# Patient Record
Sex: Male | Born: 1990 | Race: White | Hispanic: No | Marital: Single | Smoking: Never smoker
Health system: Southern US, Community
[De-identification: ages and names within clinical notes are randomized; demographics above are authoritative.]

## PROBLEM LIST (undated history)

## (undated) DIAGNOSIS — D649 Anemia, unspecified: Secondary | ICD-10-CM

## (undated) DIAGNOSIS — R109 Unspecified abdominal pain: Secondary | ICD-10-CM

## (undated) DIAGNOSIS — K589 Irritable bowel syndrome without diarrhea: Secondary | ICD-10-CM

## (undated) HISTORY — DX: Unspecified abdominal pain: R10.9

## (undated) HISTORY — PX: WISDOM TOOTH EXTRACTION: SHX21

## (undated) HISTORY — DX: Irritable bowel syndrome, unspecified: K58.9

## (undated) HISTORY — DX: Anemia, unspecified: D64.9

---
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
## 2012-03-03 ENCOUNTER — Emergency Department
Admission: EM | Admit: 2012-03-03 | Discharge: 2012-03-03 | Disposition: A | Discharge status: Home / Self Care | Payer: Managed Care, Other (non HMO) | Source: Home / Self Care

## 2012-03-03 ENCOUNTER — Encounter: Payer: Self-pay | Admitting: *Deleted

## 2012-03-03 DIAGNOSIS — Z9181 History of falling: Secondary | ICD-10-CM

## 2012-03-03 DIAGNOSIS — T1490XA Injury, unspecified, initial encounter: Secondary | ICD-10-CM

## 2012-03-03 NOTE — ED Provider Notes (Signed)
History     CSN: [id number]  Arrival date & time 03/03/12  1150   None     Chief Complaint  Patient presents with  . Fall   HPI Comments: Pt was playing soccer earlier today.when pt fell and hit back of his on the grass. Pt was sent her by training staff for medical clearance. Pt denies any LOC, headache, confusion, blurry vision, vision loss, or confusion associated with the episode. Pt denies any headache or head pain.   Patient is a 21 y.o. male presenting with fall. The history is provided by the patient.  Fall The accident occurred less than 1 hour ago. The fall occurred while recreating/playing. Distance fallen: fell while playng soccer  Pertinent negatives include no numbness and no headaches.    Past Medical History  Diagnosis Date  . Abdominal pain, recurrent   . Anemia     History reviewed. No pertinent past surgical history.  Family History  Problem Relation Age of Onset  . Cancer Mother     luekemia  . Cholelithiasis Mother   . Ulcers Maternal Aunt   . Inflammatory bowel disease Neg Hx   . Celiac disease Neg Hx     History  Substance Use Topics  . Smoking status: Never Smoker   . Smokeless tobacco: Not on file  . Alcohol Use: No      Review of Systems  Constitutional: Negative for appetite change and fatigue.  HENT: Negative for neck stiffness.   Eyes: Negative for photophobia and visual disturbance.  Cardiovascular: Negative for chest pain.  Musculoskeletal: Negative for arthralgias.  Neurological: Negative for dizziness, tremors, speech difficulty, weakness, numbness and headaches.    Allergies  Review of patient's allergies indicates no known allergies.  Home Medications   Current Outpatient Rx  Name Route Sig Dispense Refill  . DICLOFENAC POTASSIUM 50 MG PO TABS      . HYDROCODONE-ACETAMINOPHEN 5-500 MG PO TABS        BP 114/75  Pulse 57  Temp(Src) 98.1 F (36.7 C) (Oral)  Resp 18  Ht 5\' 7"  (1.702 m)  Wt 152 lb 8 oz (69.174 kg)   BMI 23.89 kg/m2  SpO2 100%  Physical Exam  Constitutional: He is oriented to person, place, and time. He appears well-developed and well-nourished.  HENT:  Head: Normocephalic and atraumatic.  Right Ear: External ear normal.  Left Ear: External ear normal.  Eyes: Conjunctivae are normal. Pupils are equal, round, and reactive to light.  Neck: Normal range of motion. Neck supple.  Cardiovascular: Normal rate and regular rhythm.   Pulmonary/Chest: Effort normal and breath sounds normal.  Abdominal: Soft. Bowel sounds are normal.  Musculoskeletal: Normal range of motion.  Neurological: He is alert and oriented to person, place, and time. No cranial nerve deficit. Coordination normal.       No focal neurological deficits noted on full neurological exam.   Skin: Skin is warm and dry. There is pallor.  Psychiatric: He has a normal mood and affect.    ED Course  Procedures (including critical care time)  Labs Reviewed - No data to display No results found.   No diagnosis found.    MDM  Otherwise normal physical exam. Pt is otherwise cleared to return to play today. Discussed concussive and neuro red flags including headache, dizziness, LOC, vomiting. Extensive counseling done. Concussion handout given. Will follow prn.         Floydene Flock, MD 03/03/12 1455  Floydene Flock, MD  03/03/12 1456 

## 2012-03-03 NOTE — ED Notes (Signed)
Pt was knocked to the ground this morning in a soccer game and hit the back of his head on the ground.  Pt denies any pain or symptoms

## 2012-03-03 NOTE — Discharge Instructions (Signed)
Concussion and Brain Injury A blow to the head can stop the brain from working normally (concussion). It is usually not life-threatening. However, the results of the injury can be serious. Problems caused by the injury might show up right away or days or weeks later. Getting better might take some time. HOME CARE  Rest your body. Ways to rest your body include:   Getting plenty of sleep at night.   Going to sleep early.   Taking naps during the day when you feel tired.   Limit activities that require a lot of thought. This includes:   Time spent with homework.   Time spent with work related to a job.   TV watching.   Computer use.   Return to normal activities (driving, work, school) only when your doctor says it is okay.   Avoid high impact activity and sports until your doctor says it is okay.   Take medicines only as told by your doctor.   Do not drink alcohol until your doctor says it is okay.   Do not make important decisions without help until you feel better.   Follow up with your doctor as told.  GET HELP RIGHT AWAY IF:  You, your family, or your friends notice that:  You have bad headaches, or they get worse.   You have weakness, loss of feeling (numbness), or you feel off balance.   You keep throwing up (vomiting).   You feel tired or pass out (faint).   One black center of your eye (pupil) is larger than the other.   You twitch or shake (seize).   Your speech is not clear (slurred).   You are confused, restless, easily angered (agitated), or annoyed (irritable).   You cannot recognize or respond to people or activities.   You have neck pain.   You have trouble being woken up.   Your behavior changes.  MAKE SURE YOU:   Understand these instructions.   Will watch your condition.   Will get help right away if you are not doing well or get worse.  Document Released: 10/05/2009 Document Revised: 10/06/2011 Document Reviewed:  10/05/2009 [employer] Patient Information 2012 [employer]. 

## 2012-03-05 NOTE — ED Provider Notes (Signed)
Agree with exam, assessment, and plan.   Lattie Haw, MD 03/05/12 [phone]

## 2012-03-16 ENCOUNTER — Ambulatory Visit: Payer: Managed Care, Other (non HMO) | Admitting: *Deleted

## 2012-03-19 ENCOUNTER — Ambulatory Visit: Payer: Managed Care, Other (non HMO) | Admitting: Family Medicine

## 2012-03-20 ENCOUNTER — Ambulatory Visit (INDEPENDENT_AMBULATORY_CARE_PROVIDER_SITE_OTHER): Payer: Managed Care, Other (non HMO) | Admitting: Family Medicine

## 2012-03-20 DIAGNOSIS — Z23 Encounter for immunization: Secondary | ICD-10-CM

## 2012-03-20 DIAGNOSIS — Z0000 Encounter for general adult medical examination without abnormal findings: Secondary | ICD-10-CM

## 2012-05-04 ENCOUNTER — Telehealth: Payer: Self-pay | Admitting: Family Medicine

## 2012-05-04 DIAGNOSIS — R109 Unspecified abdominal pain: Secondary | ICD-10-CM

## 2012-05-04 NOTE — Telephone Encounter (Signed)
Pt would like to be referred to GI pt is still experiencing same symptoms as last year.

## 2012-05-05 NOTE — Telephone Encounter (Signed)
ok 

## 2012-05-16 ENCOUNTER — Telehealth: Payer: Self-pay | Admitting: Family Medicine

## 2012-05-16 NOTE — Telephone Encounter (Signed)
Mailed 55 pages to Dr. Kelle Darting for review on 05-16-12 ym

## 2012-05-16 NOTE — Telephone Encounter (Signed)
Forward/fax 2 pages to Dr. Kelle Darting for review on 05-16-12 ym

## 2012-05-16 NOTE — Telephone Encounter (Signed)
Forward 4 pages to Dr. Kelle Darting for review on 05-16-12 ym

## 2012-05-22 ENCOUNTER — Ambulatory Visit: Payer: Managed Care, Other (non HMO) | Admitting: Gastroenterology

## 2012-06-12 ENCOUNTER — Encounter: Payer: Self-pay | Admitting: Gastroenterology

## 2012-06-12 ENCOUNTER — Other Ambulatory Visit (INDEPENDENT_AMBULATORY_CARE_PROVIDER_SITE_OTHER): Payer: Managed Care, Other (non HMO)

## 2012-06-12 ENCOUNTER — Ambulatory Visit (INDEPENDENT_AMBULATORY_CARE_PROVIDER_SITE_OTHER): Payer: Managed Care, Other (non HMO) | Admitting: Gastroenterology

## 2012-06-12 VITALS — BP 118/64 | HR 76 | Ht 67.0 in | Wt 154.0 lb

## 2012-06-12 DIAGNOSIS — K589 Irritable bowel syndrome without diarrhea: Secondary | ICD-10-CM

## 2012-06-12 DIAGNOSIS — R197 Diarrhea, unspecified: Secondary | ICD-10-CM

## 2012-06-12 LAB — BASIC METABOLIC PANEL
BUN: 13 mg/dL (ref 6–23)
CO2: 26 mEq/L (ref 19–32)
Calcium: 9.5 mg/dL (ref 8.4–10.5)
Creatinine, Ser: 0.9 mg/dL (ref 0.4–1.5)
Glucose, Bld: 87 mg/dL (ref 70–99)

## 2012-06-12 LAB — CBC WITH DIFFERENTIAL/PLATELET
Basophils Absolute: 0 10*3/uL (ref 0.0–0.1)
Basophils Relative: 0.4 % (ref 0.0–3.0)
Eosinophils Absolute: 0.1 10*3/uL (ref 0.0–0.7)
Lymphocytes Relative: 38.8 % (ref 12.0–46.0)
MCHC: 32.7 g/dL (ref 30.0–36.0)
Monocytes Absolute: 0.4 10*3/uL (ref 0.1–1.0)
Neutrophils Relative %: 47.4 % (ref 43.0–77.0)
Platelets: 202 10*3/uL (ref 150.0–400.0)
RBC: 5.11 Mil/uL (ref 4.22–5.81)

## 2012-06-12 LAB — HEPATIC FUNCTION PANEL
ALT: 25 U/L (ref 0–53)
Albumin: 4.3 g/dL (ref 3.5–5.2)
Total Bilirubin: 0.5 mg/dL (ref 0.3–1.2)

## 2012-06-12 LAB — C-REACTIVE PROTEIN: CRP: <1 mg/dL (ref 1–20)

## 2012-06-12 LAB — FERRITIN: Ferritin: 12.1 ng/mL — ABNORMAL LOW (ref 22.0–322.0)

## 2012-06-12 MED ORDER — HYOSCYAMINE SULFATE 0.125 MG SL SUBL: 0.1250 mg | SUBLINGUAL_TABLET | SUBLINGUAL | Status: DC | PRN

## 2012-06-12 NOTE — Progress Notes (Signed)
History of Present Illness:  This is a 21 year old Caucasian male who has had abdominal cramping and loose stools with occasional constipation for the last 2 years. He is been expertly evaluated by Dr. Bing Plume, pediatric gastroenterology. His records and labs were reviewed in detail today. The patient continues with some lower abdominal cramping, incomplete rectal emptying without rectal bleeding or systemic, upper GI, or hepatobiliary complaints. His appetite is good and his weight is stable, and he denies any specific food intolerances. The patient denies use of sorbitol or by mouth fructose. There has been some question about lactose intolerance, but the patient denies problems with lactose. One year ago the patient had upper GI small bowel series that was normal. Stool exams of all been negative including examination for fecal leukocytes. At one point the patient had mild iron deficiency anemia felt secondary to running, and he apparently responded to by mouth oral iron therapy by Dr. Kelle Darting. Throughout the exam and interview the patient's father was in the room, and he relates that the patient has not had problems throughout his childhood with gastrointestinal problems. They have traveled extensively, but have not had enteritis or dysentery. Prior evaluation for celiac disease was negative.  I have reviewed this patient's present history, medical and surgical past history, allergies and medications.     ROS: The remainder of the 10 point ROS is negative     Physical Exam: Blood pressure 118/64, pulse 76, weight 154 pounds with BMI 24.12. General well developed well nourished patient in no acute distress, appearing HIS stated age Eyes PERRLA, no icterus, fundoscopic exam per opthamologist Skin no lesions noted Neck supple, no adenopathy, no thyroid enlargement, no tenderness Chest clear to percussion and auscultation Heart no significant murmurs, gallops or rubs noted Abdomen no  hepatosplenomegaly masses or tenderness, BS normal Extremities no acute joint lesions, edema, phlebitis or evidence of cellulitis. Neurologic patient oriented x 3, cranial nerves intact, no focal neurologic deficits noted. Psychological mental status normal and normal affect.  Assessment and plan: IBS with incomplete sigmoid colon emptying. I have placed him on a fodmap-ibs diet and have prescribed daily Metamucil 1 heaping teaspoon which use every morning. Also have prescribed when necessary sublingual Levsin as tolerated. We will repeat his labs including followup iron studies, CRP, Giardia antibodies, and I have shown the patient and his father a movie on IBS and its management. Repeat celiac serologies also ordered.  Please copy her primary care physician, referring physician, and pertinent subspecialists. No diagnosis found.

## 2012-06-12 NOTE — Patient Instructions (Addendum)
Your physician has requested that you go to the basement for the following lab work before leaving today: Vitamin B12, TSH, IBC panel, Hepatic function panel, Giardia Lamblia, Folate, Ferritin, Celiac panel, CBC, CRP, Bmet.  We have sent the following medications to your pharmacy for you to pick up at your convenience: Levsin.  You have been given a Fodmap diet and you watched the IBS video today.  cc: Kelle Darting, MD

## 2012-06-13 LAB — CELIAC PANEL 10
Endomysial Screen: NEGATIVE
IgA: 113 mg/dL (ref 68–379)
Tissue Transglut Ab: 5.3 U/mL (ref <20)
Tissue Transglutaminase Ab, IgA: 3.1 U/mL (ref <20)

## 2012-06-15 LAB — GIARDIA LAMBLIA ANTIBODY, IFA

## 2012-08-31 ENCOUNTER — Telehealth: Payer: Self-pay | Admitting: Family Medicine

## 2012-08-31 NOTE — Telephone Encounter (Signed)
Caller: Lance/Father; Patient Name: Antonio Galvan; PCP: Kelle Darting [facility]); Best Callback Phone Number: [phone]. DAD reports while at college, having anxiety issues. Health information. Advised appointment needed. States he will check with insurance to see if referral needed? call back parameters reviewed.

## 2012-09-25 ENCOUNTER — Ambulatory Visit: Payer: Managed Care, Other (non HMO) | Admitting: Family Medicine

## 2012-09-26 ENCOUNTER — Encounter: Payer: Self-pay | Admitting: Family Medicine

## 2012-09-26 ENCOUNTER — Ambulatory Visit (INDEPENDENT_AMBULATORY_CARE_PROVIDER_SITE_OTHER): Payer: BC Managed Care – PPO | Admitting: Family Medicine

## 2012-09-26 VITALS — BP 110/80 | Temp 98.1°F | Wt 155.0 lb

## 2012-09-26 DIAGNOSIS — F988 Other specified behavioral and emotional disorders with onset usually occurring in childhood and adolescence: Secondary | ICD-10-CM | POA: Insufficient documentation

## 2012-09-26 MED ORDER — AMPHETAMINE-DEXTROAMPHET ER 10 MG PO CP24: 10.0000 mg | ORAL_CAPSULE | ORAL | Status: DC

## 2012-09-26 NOTE — Patient Instructions (Signed)
Take the Adderall one daily  Return January 2 for followup

## 2012-09-26 NOTE — Progress Notes (Signed)
  Subjective:    Patient ID: Antonio Galvan, male    DOB: Aug 11, 1991, 21 y.o.   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 Since sees gynecology central of focus and concentration. He went to the school [facility] and he was given Medrol Zoloft 50 mg daily which she started about 4 weeks ago. It hasn't seemed to help.  Family history pertinent and he thinks his mother has ADD untreated and undiagnosed   Review of Systems    general review of systems otherwise negative Objective:   Physical Exam  Well-developed well-nourished in no acute distress      Assessment & Plan:  Question ADD plan trial of Adderall return in 3 weeks for followup

## 2012-11-01 ENCOUNTER — Ambulatory Visit: Payer: BC Managed Care – PPO | Admitting: Family Medicine

## 2013-04-11 ENCOUNTER — Encounter: Payer: Self-pay | Admitting: Family Medicine

## 2013-04-11 ENCOUNTER — Ambulatory Visit (INDEPENDENT_AMBULATORY_CARE_PROVIDER_SITE_OTHER): Payer: BC Managed Care – PPO | Admitting: Family Medicine

## 2013-04-11 VITALS — BP 140/90 | Temp 98.8°F | Wt 172.0 lb

## 2013-04-11 DIAGNOSIS — K623 Rectal prolapse: Secondary | ICD-10-CM | POA: Insufficient documentation

## 2013-04-11 DIAGNOSIS — K589 Irritable bowel syndrome without diarrhea: Secondary | ICD-10-CM

## 2013-04-11 NOTE — Progress Notes (Signed)
  Subjective:    Patient ID: Antonio Galvan, male    DOB: 1991/01/20, 22 y.o.   MRN: [id number]  HPI Antonio Galvan is a 22 year old male single nonsmoker who comes in today for evaluation of "GI concerns"  He's had a complete GI workup year ago by Dr. Jarold Motto. The analysis showed that he had IBS. They've given him Levsin to take when necessary. He's tried to adjust his diet but doesn't seem to make any difference. His gluten studies were all normal. 2 months ago he noticed it when he has a bowel movement he is having rectal prolapse   Review of Systems    review of systems negative Objective:   Physical Exam Well-developed well-nourished male no acute distress examination of the anus shows a normal anus no external hemorrhoids.       Assessment & Plan:    IBS  Patient concerned about prolapsing rectum.........Marland Kitchen Will defer rectal exam to Dr. Jarold Motto

## 2013-04-11 NOTE — Patient Instructions (Signed)
Call Dr. Norval Gable office,,,,,,,, [phone],,,,,, and make an appointment to see him for evaluation

## 2013-04-23 ENCOUNTER — Encounter: Payer: Self-pay | Admitting: Gastroenterology

## 2013-04-23 ENCOUNTER — Ambulatory Visit (INDEPENDENT_AMBULATORY_CARE_PROVIDER_SITE_OTHER): Payer: BC Managed Care – PPO | Admitting: Gastroenterology

## 2013-04-23 ENCOUNTER — Other Ambulatory Visit (INDEPENDENT_AMBULATORY_CARE_PROVIDER_SITE_OTHER): Payer: BC Managed Care – PPO

## 2013-04-23 VITALS — BP 108/70 | HR 59 | Ht 68.0 in | Wt 172.0 lb

## 2013-04-23 DIAGNOSIS — K623 Rectal prolapse: Secondary | ICD-10-CM

## 2013-04-23 DIAGNOSIS — R197 Diarrhea, unspecified: Secondary | ICD-10-CM

## 2013-04-23 DIAGNOSIS — K589 Irritable bowel syndrome without diarrhea: Secondary | ICD-10-CM

## 2013-04-23 LAB — HEPATIC FUNCTION PANEL
ALT: 26 U/L (ref 0–53)
AST: 28 U/L (ref 0–37)
Albumin: 4.6 g/dL (ref 3.5–5.2)
Alkaline Phosphatase: 90 U/L (ref 39–117)
Bilirubin, Direct: 0.1 mg/dL (ref 0.0–0.3)
Total Bilirubin: 0.7 mg/dL (ref 0.3–1.2)
Total Protein: 7.9 g/dL (ref 6.0–8.3)

## 2013-04-23 LAB — CBC WITH DIFFERENTIAL/PLATELET
Basophils Absolute: 0 10*3/uL (ref 0.0–0.1)
Eosinophils Absolute: 0.1 10*3/uL (ref 0.0–0.7)
Lymphocytes Relative: 36.6 % (ref 12.0–46.0)
MCHC: 32.8 g/dL (ref 30.0–36.0)
Monocytes Relative: 10.8 % (ref 3.0–12.0)
Neutrophils Relative %: 50.6 % (ref 43.0–77.0)
RDW: 13.3 % (ref 11.5–14.6)

## 2013-04-23 LAB — BASIC METABOLIC PANEL
CO2: 29 mEq/L (ref 19–32)
Chloride: 102 mEq/L (ref 96–112)
Potassium: 4.1 mEq/L (ref 3.5–5.1)
Sodium: 141 mEq/L (ref 135–145)

## 2013-04-23 LAB — IBC PANEL
Iron: 134 ug/dL (ref 42–165)
Saturation Ratios: 29.9 % (ref 20.0–50.0)
Transferrin: 320.3 mg/dL (ref 212.0–360.0)

## 2013-04-23 MED ORDER — NA SULFATE-K SULFATE-MG SULF 17.5-3.13-1.6 GM/177ML PO SOLN: ORAL | Status: DC

## 2013-04-23 NOTE — Progress Notes (Addendum)
This is a 22 year old college student who has IBS predominant diarrhea.  He has at home from school in his diarrhea seems to have abated with a regular wholesome diet.  He admits that greasy fast foods give him diarrhea, also worsened by stress.  His main concern is rectal prolapse it seems to be occurring fairly regularly with diarrhea type stools but no rectal bleeding or significant rectal pain or abdominal pain.  His appetite is good and he is maintaining a stable weight.  He has had no problems with perianal fissures, fistulas, abscesses, and denies systemic complaints, upper GI or hepatobiliary complaints.  His lab testing was unremarkable without any evidence of positive celiac serologies.  There is a family member with Crohn's disease however.  Current Medications, Allergies, Past Medical History, Past Surgical History, Family History and Social History were reviewed in [employer] record.  ROS: All systems were reviewed and are negative unless otherwise stated in the HPI.          Physical Exam: Healthy patient in no distress.  Blood pressure 108/70, pulse 59, and weight 172 with a BMI of 26.16.  His abdomen shows no organomegaly, masses or tenderness.  Inspection the rectum is generally unremarkable.  Rectal exam shows a somewhat diminished sphincter tone and diminished anal wink response to scratch testing.  Anoscopy shows some internal hemorrhoidal tissue but no fissures, fistula, or other abnormalities.  Stool guaiac negative.  No perianal fissures or fistulae noted.  Mental status is normal.    Assessment and Plan: This patient has diarrhea predominant IBS, but I am concerned about his rectal problems consisting of poor rectal external sphincter tone, rectal prolapse, and we certainly need to exclude inflammatory bowel disease, Crohn's disease of the rectum, versus some type of neuromuscular process involving his rectum.  I've scheduled him for colonoscopy exam,  and if this is unremarkable, we will arrange anorectal manometry and referral to Dr. Romie Levee at [facility].  I have placed him on a FOD-MAP diet for IBS and we will check screening labs including sed rate, IBD serologic markers, CBC a metabolic profile. No diagnosis found.

## 2013-04-23 NOTE — Patient Instructions (Addendum)
  You have been scheduled for a colonoscopy with propofol. Please follow written instructions given to you at your visit today.  Please pick up your prep kit at the pharmacy within the next 1-3 days. If you use inhalers (even only as needed), please bring them with you on the day of your procedure. Your physician has requested that you go to www.startemmi.com and enter the access code given to you at your visit today. This web site gives a general overview about your procedure. However, you should still follow specific instructions given to you by our office regarding your preparation for the procedure.  FOD MAP Diet given for your review.   Your physician has requested that you go to the basement for the following lab work before leaving today: IBD Serology Anemia Panel CBC TSH BMP Hepatic Function Sedimentation Rate  _________________________________________                                               We are excited to introduce MyChart, a new best-in-class service that provides you online access to important information in your electronic medical record. We want to make it easier for you to view your health information - all in one secure location - when and where you need it. We expect MyChart will enhance the quality of care and service we provide.  When you register for MyChart, you can:    View your test results.    Request appointments and receive appointment reminders via email.    Request medication renewals.    View your medical history, allergies, medications and immunizations.    Communicate with your physician's office through a password-protected site.    Conveniently print information such as your medication lists.  To find out if MyChart is right for you, please talk to a member of our clinical staff today. We will gladly answer your questions about this free health and wellness tool.  If you are age 18 or older and want a member of your family to have access  to your record, you must provide written consent by completing a proxy form available at our office. Please speak to our clinical staff about guidelines regarding accounts for patients younger than age 84.  As you activate your MyChart account and need any technical assistance, please call the MyChart technical support line at [phone]) [phone]-CHART [phone]) or email your question to [email or web address]. If you email your question(s), please include your name, a return phone number and the best time to reach you.  If you have non-urgent health-related questions, you can send a message to our office through MyChart at [email or web address]. If you have a medical emergency, call 911.  Thank you for using MyChart as your new health and wellness resource!   MyChart licensed from [employer],  [phone]. Patents Pending.

## 2013-04-24 ENCOUNTER — Encounter: Payer: Self-pay | Admitting: Gastroenterology

## 2013-04-24 LAB — TSH: TSH: 1.21 u[IU]/mL (ref 0.35–5.50)

## 2013-04-24 LAB — FOLATE: Folate: 14.1 ng/mL (ref >5.9)

## 2013-04-24 LAB — FERRITIN: Ferritin: 18.8 ng/mL — ABNORMAL LOW (ref 22.0–322.0)

## 2013-04-24 LAB — VITAMIN B12: Vitamin B-12: 417 pg/mL (ref 211–911)

## 2013-04-26 ENCOUNTER — Encounter: Payer: Self-pay | Admitting: Gastroenterology

## 2013-04-26 ENCOUNTER — Telehealth: Payer: Self-pay | Admitting: *Deleted

## 2013-04-26 ENCOUNTER — Ambulatory Visit (AMBULATORY_SURGERY_CENTER): Payer: BC Managed Care – PPO | Admitting: Gastroenterology

## 2013-04-26 VITALS — BP 102/42 | HR 52 | Temp 98.0°F | Resp 11 | Ht 68.0 in | Wt 172.0 lb

## 2013-04-26 DIAGNOSIS — Z8719 Personal history of other diseases of the digestive system: Secondary | ICD-10-CM

## 2013-04-26 DIAGNOSIS — K623 Rectal prolapse: Secondary | ICD-10-CM

## 2013-04-26 DIAGNOSIS — R197 Diarrhea, unspecified: Secondary | ICD-10-CM

## 2013-04-26 MED ORDER — SODIUM CHLORIDE 0.9 % IV SOLN: 500.0000 mL | INTRAVENOUS | Status: DC

## 2013-04-26 NOTE — Progress Notes (Signed)
Procedure ends, to recovery, report given to Merlene Laughter, RN and VSS

## 2013-04-26 NOTE — Patient Instructions (Addendum)
Discharge instructions given with verbal understanding. Biopsies taken. Surgical consult. Resume previous medications. YOU HAD AN ENDOSCOPIC PROCEDURE TODAY AT THE [facility]: Refer to the procedure report that was given to you for any specific questions about what was found during the examination.  If the procedure report does not answer your questions, please call your gastroenterologist to clarify.  If you requested that your care partner not be given the details of your procedure findings, then the procedure report has been included in a sealed envelope for you to review at your convenience later.  YOU SHOULD EXPECT: Some feelings of bloating in the abdomen. Passage of more gas than usual.  Walking can help get rid of the air that was put into your GI tract during the procedure and reduce the bloating. If you had a lower endoscopy (such as a colonoscopy or flexible sigmoidoscopy) you may notice spotting of blood in your stool or on the toilet paper. If you underwent a bowel prep for your procedure, then you may not have a normal bowel movement for a few days.  DIET: Your first meal following the procedure should be a light meal and then it is ok to progress to your normal diet.  A half-sandwich or bowl of soup is an example of a good first meal.  Heavy or fried foods are harder to digest and may make you feel nauseous or bloated.  Likewise meals heavy in dairy and vegetables can cause extra gas to form and this can also increase the bloating.  Drink plenty of fluids but you should avoid alcoholic beverages for 24 hours.  ACTIVITY: Your care partner should take you home directly after the procedure.  You should plan to take it easy, moving slowly for the rest of the day.  You can resume normal activity the day after the procedure however you should NOT DRIVE or use heavy machinery for 24 hours (because of the sedation medicines used during the test).    SYMPTOMS TO REPORT IMMEDIATELY: A  gastroenterologist can be reached at any hour.  During normal business hours, 8:30 AM to 5:00 PM Monday through Friday, call [phone].  After hours and on weekends, please call the GI answering service at [phone] who will take a message and have the physician on call contact you.   Following lower endoscopy (colonoscopy or flexible sigmoidoscopy):  Excessive amounts of blood in the stool  Significant tenderness or worsening of abdominal pains  Swelling of the abdomen that is new, acute  Fever of 100F or higher  FOLLOW UP: If any biopsies were taken you will be contacted by phone or by letter within the next 1-3 weeks.  Call your gastroenterologist if you have not heard about the biopsies in 3 weeks.  Our staff will call the home number listed on your records the next business day following your procedure to check on you and address any questions or concerns that you may have at that time regarding the information given to you following your procedure. This is a courtesy call and so if there is no answer at the home number and we have not heard from you through the emergency physician on call, we will assume that you have returned to your regular daily activities without incident.  SIGNATURES/CONFIDENTIALITY: You and/or your care partner have signed paperwork which will be entered into your electronic medical record.  These signatures attest to the fact that that the information above on your After Visit Summary has  been reviewed and is understood.  Full responsibility of the confidentiality of this discharge information lies with you and/or your care-partner.

## 2013-04-26 NOTE — Progress Notes (Signed)
Called to room to assist during endoscopic procedure.  Patient ID and intended procedure confirmed with present staff. Received instructions for my participation in the procedure from the performing physician.  

## 2013-04-26 NOTE — Telephone Encounter (Signed)
Per Dr Jarold Motto, please schedule pt to see Dr Romie Levee at [facility] for rectal prolapse. Pt is scheduled for 05/14/13 at 10:10am. l have informed RN, Merlene Laughter in [facility] will explain the appt to pt and parents.

## 2013-04-26 NOTE — Progress Notes (Signed)
Patient did not experience any of the following events: a burn prior to discharge; a fall within the facility; wrong site/side/patient/procedure/implant event; or a hospital transfer or hospital admission upon discharge from the facility. (G8907) Patient did not have preoperative order for IV antibiotic SSI prophylaxis. (G8918)  

## 2013-04-26 NOTE — Op Note (Signed)
[facility] [address] [place], [address]   COLONOSCOPY PROCEDURE REPORT  PATIENT: Antonio Galvan, Antonio Galvan  MR#: [id number] BIRTHDATE: 1991-02-10 , 21  yrs. old GENDER: Male ENDOSCOPIST: Mardella Layman, MD, [facility] REFERRED BY: PROCEDURE DATE:  04/26/2013 PROCEDURE:   Colonoscopy with biopsy ASA CLASS:   Class I INDICATIONS:rectal prolapse. Hx. of IBS diarrhea. MEDICATIONS: propofol (Diprivan) 350mg  IV  DESCRIPTION OF PROCEDURE:   After the risks and benefits and of the procedure were explained, informed consent was obtained.  A digital rectal exam revealed no abnormalities of the rectum,but poor rectal tone..    The LB [id number]  endoscope was introduced through the anus and advanced to the terminal ileum which was intubated for a short distance .  The quality of the prep was excellent, using MoviPrep .  The instrument was then slowly withdrawn as the colon was fully examined.     COLON FINDINGS: A normal appearing cecum, ileocecal valve, and appendiceal orifice were identified.  The ascending, hepatic flexure, transverse, splenic flexure, descending, sigmoid colon and rectum appeared unremarkable.  No polyps or cancers were seen. The ileum was intubated and appeared normal,biopsies doene,jar #1 .Random left colon biopsies in jar #2.    Retroflexed views revealed no abnormalities.     The scope was then withdrawn from the patient and the procedure completed.  COMPLICATIONS: There were no complications. ENDOSCOPIC IMPRESSION: Normal colon..no visible evidence of IBD...  RECOMMENDATIONS: 1.  Await biopsy results 2.  Continue current medications 3.  Needs ano-rectal manometry.Marland KitchenMarland Kitchen?? rectal proplapse sybdrome.Will refer to Dr Ronna Polio at [facility] for evaluation. 4. Continue FOD-MAP diet  REPEAT EXAM:  [id number], Victorino Dike MD  _______________________________ eSigned:  Mardella Layman, MD, [facility] 04/26/2013 3:28 PM     PATIENT NAME:   [name], [name] MR#: [id number]

## 2013-04-27 IMAGING — RF DG UGI W/ SMALL BOWEL
19 of 24 series · 19 of 24 positions shown · non-contrast
Comparison: None.

CLINICAL DATA: 20-year-old with anemia and diarrhea for 1 year.
Evaluate for inflammatory bowel disease.

UPPER GI W/ SMALL BOWEL
TECHNIQUE: Upper GI series performed with high density barium and
effervescent agent. Thin barium also used.  Subsequently, serial
images of the small bowel were obtained including spot views of the
terminal ileum.
Fluoroscopy Time: 3.5 minutes.

[Series 1: run · 1 of 1 slices shown (1 of 19)]
[im 1/1]
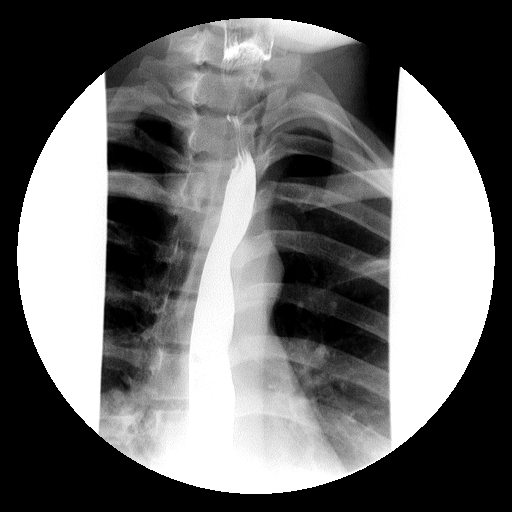

[Series 2: run · 1 of 23 slices shown (2 of 19)]
[im 1/23]
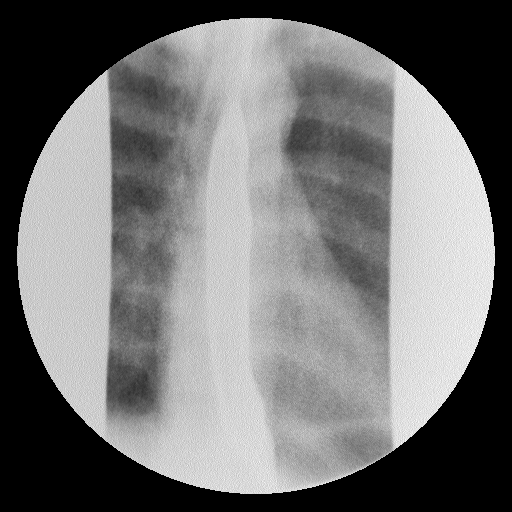

[Series 4: run · 1 of 1 slices shown (3 of 19)]
[im 1/1]
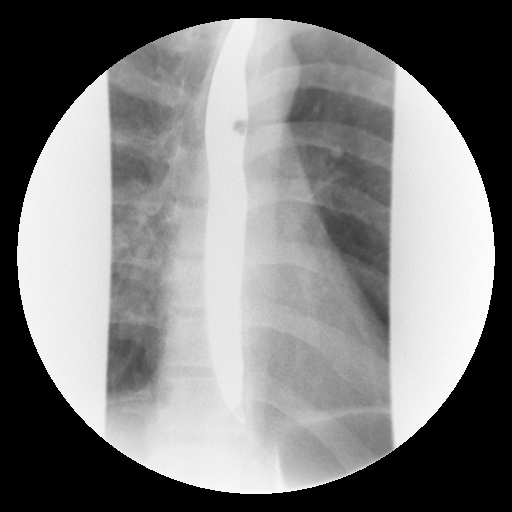

[Series 5: run · 1 of 1 slices shown (4 of 19)]
[im 1/1]
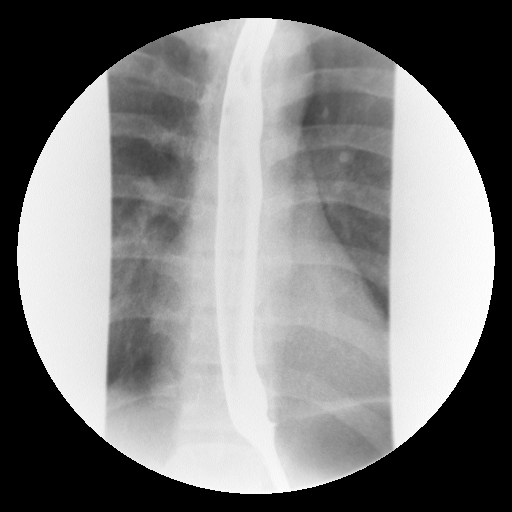

[Series 6: run · 1 of 1 slices shown (5 of 19)]
[im 1/1]
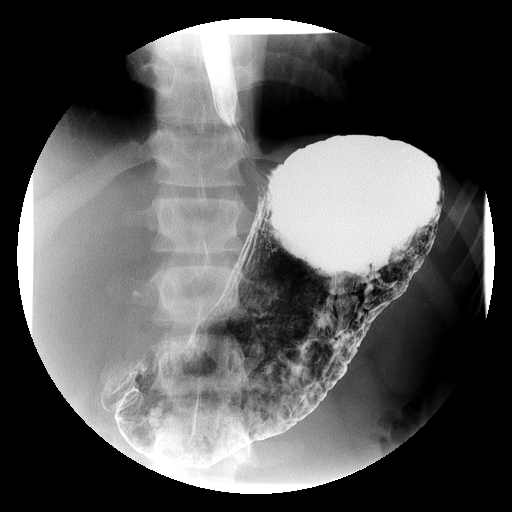

[Series 7: run · 1 of 1 slices shown (6 of 19)]
[im 1/1]
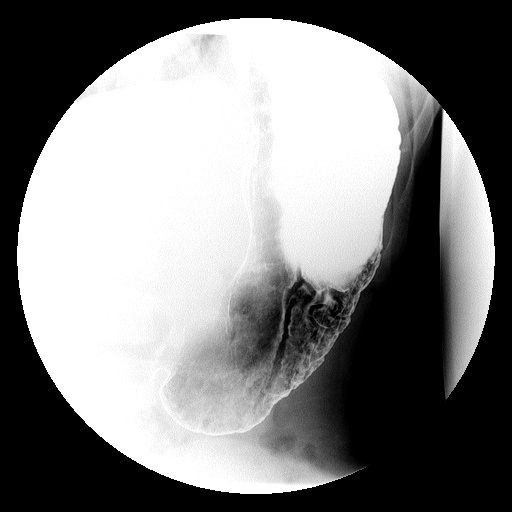

[Series 9: run · 1 of 1 slices shown (7 of 19)]
[im 1/1]
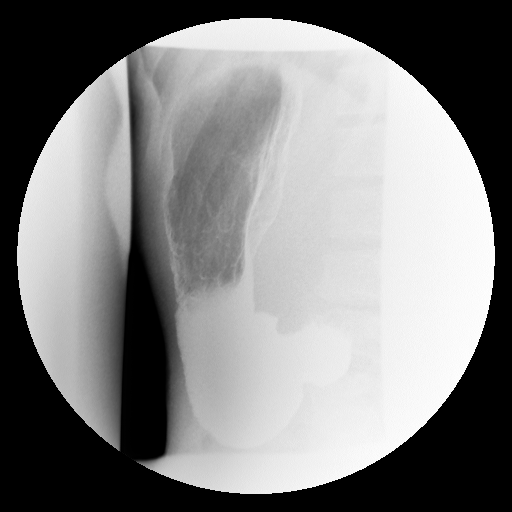

[Series 10: run · 1 of 1 slices shown (8 of 19)]
[im 1/1]
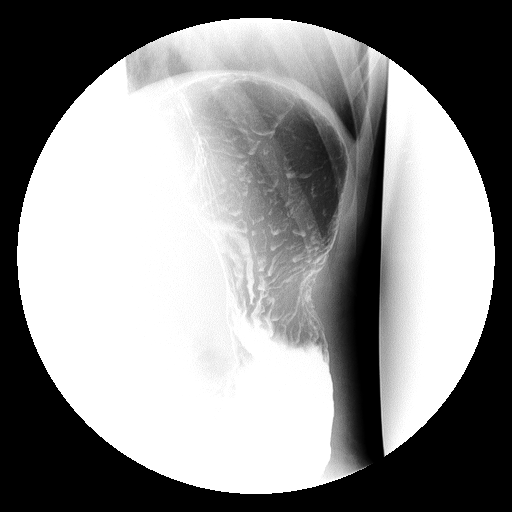

[Series 11: run · 1 of 1 slices shown (9 of 19)]
[im 1/1]
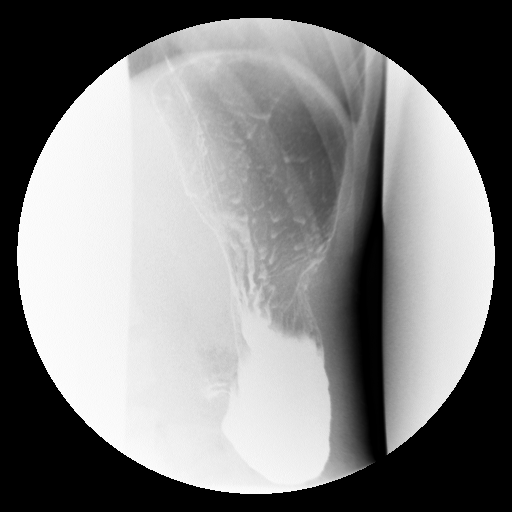

[Series 13: run · 1 of 1 slices shown (10 of 19)]
[im 1/1]
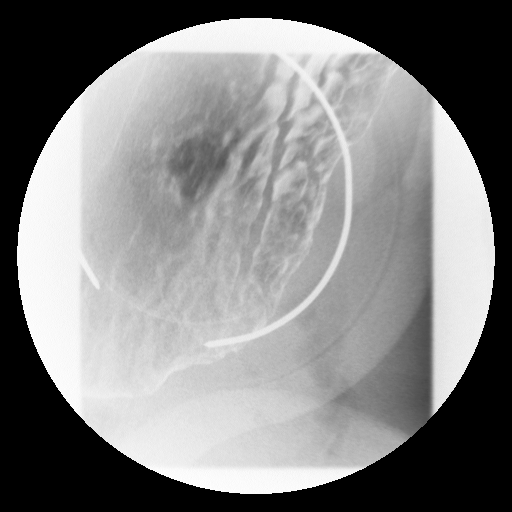

[Series 14: run · 1 of 1 slices shown (11 of 19)]
[im 1/1]
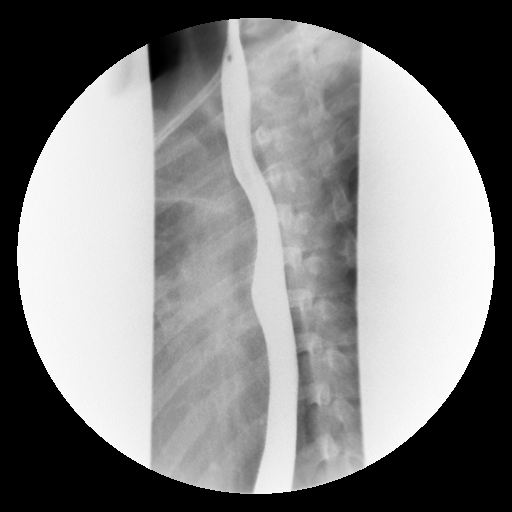

[Series 15: run · 1 of 1 slices shown (12 of 19)]
[im 1/1]
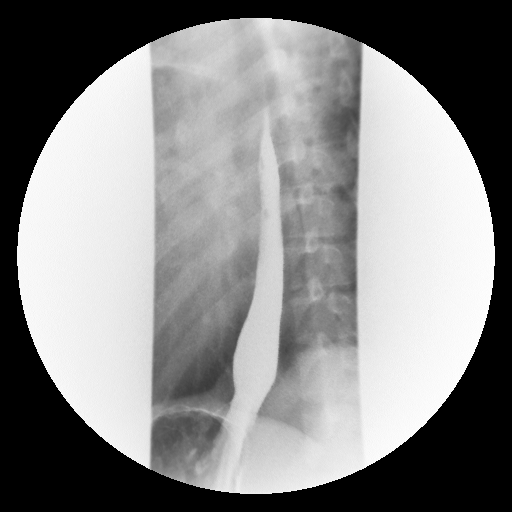

[Series 16: run · 1 of 1 slices shown (13 of 19)]
[im 1/1]
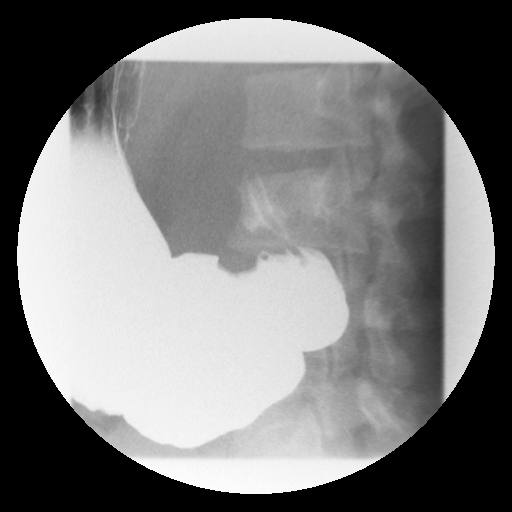

[Series 18: run · 1 of 1 slices shown (14 of 19)]
[im 1/1]
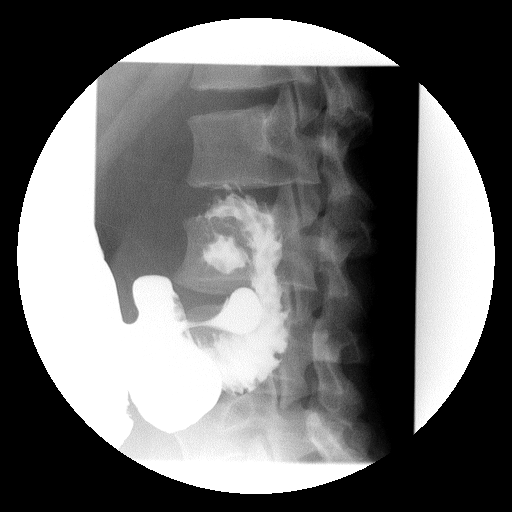

[Series 19: run · 1 of 1 slices shown (15 of 19)]
[im 1/1]
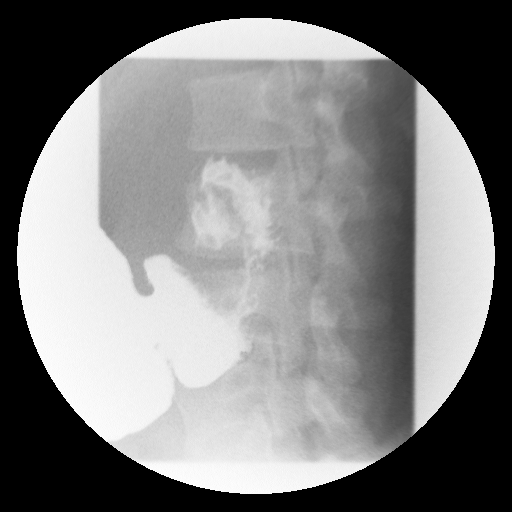

[Series 20: run · 1 of 1 slices shown (16 of 19)]
[im 1/1]
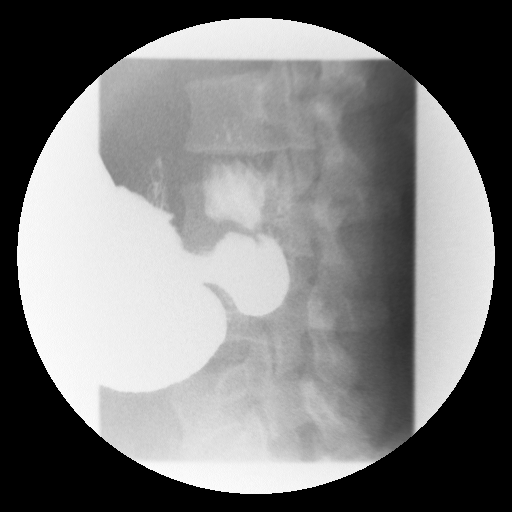

[Series 21: run · 1 of 1 slices shown (17 of 19)]
[im 1/1]
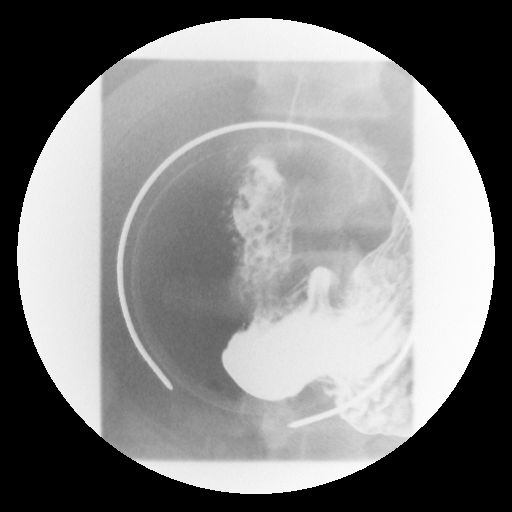

[Series 23: run · 1 of 1 slices shown (18 of 19)]
[im 1/1]
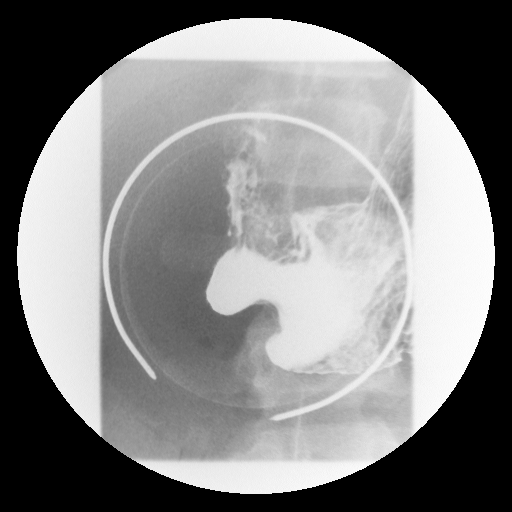

[Series 24: run · 1 of 1 slices shown (19 of 19)]
[im 1/1]
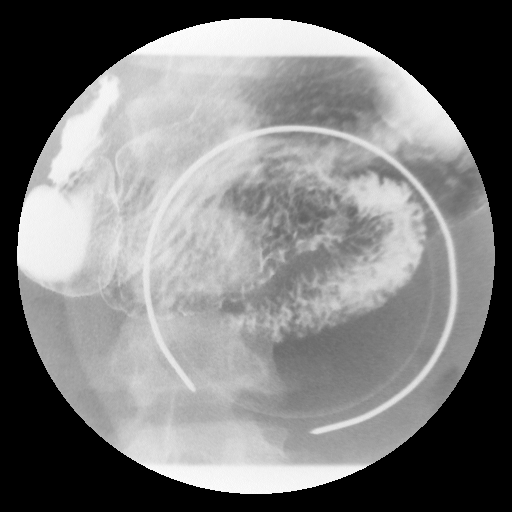

[19 of 24 positions shown; findings below may reference images not displayed]

FINDINGS: No scout radiograph was obtained due to the patient's
age.  The esophageal motility is normal.  There is no stricture,
mass or ulceration.  The stomach and duodenum appear normal without
ulceration or wall thickening.

Additional barium was administered.  Subsequent abdominal
radiographs demonstrate normal small bowel transit time with
opacification of the cecum within 1 hour of initial ingestion.  On
the overhead radiographs, there is questionable sclerosis
surrounding the right sacroiliac joint.  This can be normal for
age.  The left sacroiliac joint is obscured by barium.  At
fluoroscopy, no small bowel abnormalities are identified.  The
terminal ileum is difficult to isolate due to its inferior position
within the pelvis, but demonstrates no abnormality.
IMPRESSION: 1.  Normal upper GI series and small-bowel follow-through.  No
terminal ileal abnormality identified.
2.  Appearance of the right sacroiliac joint may be normal for age.
The possibility of sacroiliitis should be considered in this
clinical context, however.

## 2013-04-29 ENCOUNTER — Telehealth: Payer: Self-pay

## 2013-04-29 NOTE — Telephone Encounter (Signed)
Left message on answering machine. 

## 2013-04-30 ENCOUNTER — Encounter: Payer: Self-pay | Admitting: Gastroenterology

## 2013-05-01 ENCOUNTER — Other Ambulatory Visit: Payer: Self-pay | Admitting: *Deleted

## 2013-05-14 ENCOUNTER — Ambulatory Visit (INDEPENDENT_AMBULATORY_CARE_PROVIDER_SITE_OTHER): Payer: BC Managed Care – PPO | Admitting: General Surgery

## 2013-05-14 ENCOUNTER — Encounter (INDEPENDENT_AMBULATORY_CARE_PROVIDER_SITE_OTHER): Payer: Self-pay | Admitting: General Surgery

## 2013-05-14 VITALS — BP 120/78 | HR 64 | Resp 14 | Ht 68.0 in | Wt 167.0 lb

## 2013-05-14 DIAGNOSIS — K623 Rectal prolapse: Secondary | ICD-10-CM

## 2013-05-14 NOTE — Patient Instructions (Signed)
Rectal Prolapse  What is rectal prolapse? Rectal prolapse is a condition in which the rectum (the lower end of the colon, located just above the anus) becomes stretched out and protrudes out of the anus. Weakness of the anal sphincter muscle is often associated with rectal prolapse at this stage, resulting in leakage of stool or mucus. While the condition occurs in both sexes, it is much more common in women than men. Why does it occur? Several factors may contribute to the development of rectal prolapse. It may come from a lifelong habit of straining to have bowel movements or as a late consequence of the childbirth process. Rarely, there may be a genetic predisposition. It seems to be a part of the aging process in many patients who experience stretching of the ligaments that support the rectum inside the pelvis as well as weakening of the anal sphincter muscle. Sometimes rectal prolapse results from generalized pelvic floor dysfunction, in association with urinary incontinence and pelvic organ prolapse as well. Neurological problems, such as spinal cord transection or spinal cord disease, can also lead to prolapse. In most cases, however, no single cause is identified. Is rectal prolapse the same as hemorrhoids? Some of the symptoms may be the same: bleeding and/or tissue that protrudes from the rectum. Rectal prolapse, however, involves a segment of the bowel located higher up within the body, while hemorrhoids develop near the anal opening. How is rectal prolapse diagnosed? A physician can often diagnose this condition with a careful history and a complete anorectal examination. To demonstrate the prolapse, patients may be asked to sit on a commode and "strain" as if having a bowel movement. Occasionally, a rectal prolapse may be "hidden" or internal, making the diagnosis more difficult. In this situation, an x-ray examination called a videodefecogram may be helpful. This examination, which takes  x-ray pictures while the patient is having a bowel movement, can also assist the physician in determining whether surgery may be beneficial and which operation may be appropriate. Anorectal manometry may also be used to evaluate the function of the muscles around the rectum as they relate to having a bowel movement. How is rectal prolapse treated? Although constipation and straining may contribute to the development of rectal prolapse, simply correcting these problems may not improve the prolapse once it has developed. There are many different ways to surgically correct rectal prolapse. Abdominal or rectal surgery may be suggested. An abdominal repair may be approached laparoscopically in selected patients. The decision to recommend an abdominal or rectal surgery takes into account many factors, including age, physical condition, extent of prolapse and the results of various tests. How successful is treatment? A great majority of patients are completely relieved of symptoms, or are significantly helped, by the appropriate procedure. Success depends on many factors, including the status of a patient's anal sphincter muscle before surgery, whether the prolapse is internal or external, the overall condition of the patient. If the anal sphincter muscles have been weakened, either because of the rectal prolapse or for some other reason, they have the potential to regain strength after the rectal prolapse has been corrected. It may take up to a year to determine the ultimate impact of the surgery on bowel function. Chronic constipation and straining after surgical correction should be avoided.  What is a colon and rectal surgeon? Colon and rectal surgeons are experts in the surgical and non-surgical treatment of diseases of the colon, rectum and anus. They have completed advanced surgical training in the treatment   of these diseases as well as full general surgical training. Board-certified colon and rectal surgeons  complete residencies in general surgery and colon and rectal surgery, and pass intensive examinations conducted by the American Board of Surgery and the American Board of Colon and Rectal Surgery. They are well-versed in the treatment of both benign and malignant diseases of the colon, rectum and anus and are able to perform routine screening examinations and surgically treat conditions if indicated to do so.   2012 American Society of Colon & Rectal Surgeons  

## 2013-05-14 NOTE — Progress Notes (Signed)
Chief Complaint  Patient presents with  . Rectal Problems    prolapse    HISTORY: Antonio Galvan is a 22 y.o. male who presents to the office with rectal prolapse with every BM.  Other symptoms include occasional bleeding.  This had been occurring for several months.  He has had an IBS picture for about 3 years.  Eating spicey foods makes the symptoms worse.   It is continuous in nature.  His bowel habits are regular and his bowel movements are loose.  His fiber intake is dietary.  His last colonoscopy was 2 weeks ago and normal.  He does have  prolapsing tissue that spontaneously reduces.   He denies rectal trauma or congenital diseases.   Past Medical History  Diagnosis Date  . Abdominal pain, recurrent   . Anemia   . IBS (irritable bowel syndrome)       Past Surgical History  Procedure Laterality Date  . Wisdom tooth extraction          No current outpatient prescriptions on file.   No current facility-administered medications for this visit.      No Known Allergies    Family History  Problem Relation Age of Onset  . Leukemia Mother   . Cholelithiasis Mother   . Ulcers Maternal Aunt   . Inflammatory bowel disease Neg Hx   . Celiac disease Neg Hx   . Colon cancer Neg Hx     History   Social History  . Marital Status: Single    Spouse Name: N/A    Number of Children: N/A  . Years of Education: N/A   Social History Main Topics  . Smoking status: Never Smoker   . Smokeless tobacco: Never Used  . Alcohol Use: No  . Drug Use: No  . Sexually Active: None   Other Topics Concern  . None   Social History Narrative   Starting 12th grade; competitive soccer      REVIEW OF SYSTEMS - PERTINENT POSITIVES ONLY: Review of Systems - General ROS: negative for - chills, fever or weight loss Hematological and Lymphatic ROS: negative for - bleeding problems, blood clots or bruising Respiratory ROS: no cough, shortness of breath, or wheezing Cardiovascular ROS: no chest  pain or dyspnea on exertion Gastrointestinal ROS: positive for - abdominal pain and diarrhea negative for - appetite loss Genito-Urinary ROS: no dysuria, trouble voiding, or hematuria  EXAM: Filed Vitals:   05/14/13 1043  BP: 120/78  Pulse: 64  Resp: 14    General appearance: alert and cooperative Resp: clear to auscultation bilaterally Cardio: regular rate and rhythm GI: soft, non-tender; bowel sounds normal; no masses,  no organomegaly   Procedure: Anoscopy Surgeon: Maisie Fus Diagnosis: rectal prolapse  Assistant: Christella Scheuermann After the risks and benefits were explained, verbal consent was obtained for above procedure  Anesthesia: none Findings: redundant mucosa seen, min int hem's, good resting tone, minimal squeeze and push, He has a picture on his phone that is consistent with rectal prolapse    ASSESSMENT AND PLAN: Antonio Galvan is a 22 y.o. M with a several month history of rectal prolapse.  He has diarrhea predominant IBS and a colonoscopy that showed no abnormalities and biopsy neg for IBD.  On exam, he has good resting tone but a very weak external sphincter.  He denies any other muscle weakness.   I would like to get some anal manometry studies on him to start.  I'm not sure why he has this muscle  defect, which is most likely causing his prolapse.  I think he would be a good candidate for a laparoscopic rectopexy without resection.   Vanita Panda, MD Colon and Rectal Surgery / General Surgery [facility]      Visit Diagnoses: No diagnosis found.  Primary Care Physician: Evette Georges, MD

## 2013-05-16 LAB — IBD EXPANDED PANEL: ALCA: 5 units (ref 0–60)

## 2013-05-20 ENCOUNTER — Ambulatory Visit (HOSPITAL_COMMUNITY)
Admission: RE | Admit: 2013-05-20 | Discharge: 2013-05-20 | Disposition: A | Discharge status: Home / Self Care | Payer: BC Managed Care – PPO | Source: Ambulatory Visit | Attending: General Surgery | Admitting: General Surgery

## 2013-05-30 ENCOUNTER — Encounter (HOSPITAL_COMMUNITY): Payer: Self-pay | Admitting: Pharmacy Technician

## 2013-06-03 ENCOUNTER — Encounter (HOSPITAL_COMMUNITY)
Admission: RE | Disposition: A | Discharge status: Home / Self Care | Payer: Self-pay | Source: Ambulatory Visit | Attending: General Surgery

## 2013-06-03 ENCOUNTER — Ambulatory Visit (HOSPITAL_COMMUNITY)
Admission: RE | Admit: 2013-06-03 | Discharge: 2013-06-03 | Disposition: A | Discharge status: Home / Self Care | Payer: BC Managed Care – PPO | Source: Ambulatory Visit | Attending: General Surgery | Admitting: General Surgery

## 2013-06-03 ENCOUNTER — Encounter (HOSPITAL_COMMUNITY): Payer: Self-pay

## 2013-06-03 DIAGNOSIS — K623 Rectal prolapse: Secondary | ICD-10-CM | POA: Insufficient documentation

## 2013-06-03 HISTORY — PX: ANAL RECTAL MANOMETRY: SHX6358

## 2013-06-03 SURGERY — MANOMETRY, ANORECTAL
Anesthesia: Topical

## 2013-06-03 SURGICAL SUPPLY — 2 items
DRAPE UTILITY 15X26 W/TAPE STR (DRAPE) ×2 IMPLANT
GOWN PREVENTION PLUS LG XLONG (DISPOSABLE) ×2 IMPLANT

## 2013-06-04 ENCOUNTER — Encounter (HOSPITAL_COMMUNITY): Payer: Self-pay | Admitting: General Surgery

## 2013-06-06 ENCOUNTER — Ambulatory Visit (INDEPENDENT_AMBULATORY_CARE_PROVIDER_SITE_OTHER): Payer: BC Managed Care – PPO | Admitting: General Surgery

## 2013-06-06 ENCOUNTER — Encounter (INDEPENDENT_AMBULATORY_CARE_PROVIDER_SITE_OTHER): Payer: Self-pay | Admitting: General Surgery

## 2013-06-06 VITALS — BP 120/82 | HR 60 | Temp 97.9°F | Resp 12 | Ht 68.0 in | Wt 168.0 lb

## 2013-06-06 DIAGNOSIS — K623 Rectal prolapse: Secondary | ICD-10-CM

## 2013-06-06 NOTE — Progress Notes (Signed)
HISTORY: Antonio Galvan is a 22 y.o. male who presented to the office last month with rectal prolapse with every BM. Other symptoms included occasional bleeding. This has been occurring for several months. He has had an IBS picture for about 3 years. Eating spicey foods makes the symptoms worse.   His bowel habits are regular and his bowel movements are loose.  His fiber intake is dietary. His last colonoscopy was 4 weeks ago and normal.  He is here today to review his manometry results.     Objective: There were no vitals filed for this visit.  General appearance: alert and cooperative GI: soft, non-tender; bowel sounds normal; no masses,  no organomegaly  Anal manometry was performed.  Sphincter pressures relatively normal.  No abnormalities detected.    Assessment and Plan: We discussed his condition in detail.  It currently does not bother him.  He is not interested in any surgical procedures at the moment.  We have decided to watch and wait for now, and use a perineal procedure if it becomes an issue in the future.  I have also offered him a 2nd opinion at a university if they would like, given the rarity of his condition.  They will call me if they would like a referral.    .Vanita Panda, MD [facility], [place] [phone]

## 2013-06-06 NOTE — Patient Instructions (Signed)
Call the office if symptoms get worse and/or you'd like a 2nd opinion.

## 2013-08-26 ENCOUNTER — Ambulatory Visit (INDEPENDENT_AMBULATORY_CARE_PROVIDER_SITE_OTHER): Payer: BC Managed Care – PPO | Admitting: Family Medicine

## 2013-08-26 ENCOUNTER — Encounter: Payer: Self-pay | Admitting: Family Medicine

## 2013-08-26 VITALS — BP 140/90 | Temp 98.4°F | Wt 172.0 lb

## 2013-08-26 DIAGNOSIS — F411 Generalized anxiety disorder: Secondary | ICD-10-CM | POA: Insufficient documentation

## 2013-08-26 DIAGNOSIS — M545 Low back pain, unspecified: Secondary | ICD-10-CM

## 2013-08-26 NOTE — Patient Instructions (Signed)
Motrin 600 mg twice daily with food  Heating pad at bedtime...........Marland Kitchen remember to use low heat and cover the heating pad with a towel  Call the student [facility] center and explained to them your concerns. He would like to see a counselor to further define what's going on with your test anxiety

## 2013-08-26 NOTE — Progress Notes (Signed)
  Subjective:    Patient ID: Antonio Galvan, male    DOB: [birth year], 22 y.o.   MRN: [id number]  HPI  Antonio Galvan is a 22 year old single male nonsmoker who comes in today for evaluation of 2 problems  He states when he was in eighth grade he was seen and evaluated by a specialist for low back pain. He says he had an MRI that showed degenerative disc disease. I find this very interesting and eighth grader.  He says last Saturday on the 25th he was lifting weights approximately 300 pounds and he noticed some discomfort in his low back. He stopped lifting and rested over the weekend and was taken 600 mg of Motrin daily. He says the pain is sharp and he points to the mid low back as a source of his pain. It does not radiate. No bowel or bladder dysfunction. 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 He one time he was told he had ADD however the Adderall did really help. He thinks is more anxiety related to taking tests. He says he needs a note from me so that he can be given special accommodations for testing. I explained to him he would need to have a evaluation by a psychologist at the school to accomplish this  Review of Systems Review of systems otherwise negative    Objective:   Physical Exam Well-developed well-nourished male in no acute distress examination of the back shows the spine to be normal. In the supine position which she assumes without any pain both legs were of equal length. Sensation muscle strength reflexes are within normal limits. Straight leg raising negative       Assessment & Plan:  Test anxiety,,,,,,,, consult at the [facility] state  Low back pain Motrin 600 twice a day avoid lifting until pain free

## 2015-12-29 ENCOUNTER — Telehealth: Payer: Self-pay | Admitting: Family Medicine

## 2015-12-29 NOTE — Telephone Encounter (Signed)
Pt has been scheduled.  °

## 2015-12-29 NOTE — Telephone Encounter (Signed)
Pt not seen since 12/2012 and is going on a trip to [place] in a few weeks. Pt is a Dr Tawanna Cooler pt. Pt will be home from school this Friday and dad is hoping pt can be seen to fill   out a form he needs to go. Do you mind seeing this pt Friday, 3/3?

## 2015-12-29 NOTE — Telephone Encounter (Signed)
Pt called dad and has an interview on that Friday and cannot come. Pt will be in town Monday march 13. Does Dr Tawanna Cooler prefer to see?  Pt not seen in over 3 yrs anyway.

## 2015-12-29 NOTE — Telephone Encounter (Signed)
Okay for Antonio Galvan to see the patient

## 2015-12-29 NOTE — Telephone Encounter (Signed)
That is fine 

## 2015-12-29 NOTE — Telephone Encounter (Signed)
Dr Tawanna Cooler does not work on Friday he will need to see someone else

## 2016-01-11 ENCOUNTER — Ambulatory Visit (INDEPENDENT_AMBULATORY_CARE_PROVIDER_SITE_OTHER): Payer: 59 | Admitting: Adult Health

## 2016-01-11 ENCOUNTER — Encounter: Payer: Self-pay | Admitting: Adult Health

## 2016-01-11 VITALS — BP 120/80 | HR 76 | Temp 98.5°F | Ht 68.0 in | Wt 180.4 lb

## 2016-01-11 DIAGNOSIS — Z0000 Encounter for general adult medical examination without abnormal findings: Secondary | ICD-10-CM

## 2016-01-11 LAB — HEPATIC FUNCTION PANEL
ALBUMIN: 4.2 g/dL (ref 3.5–5.2)
ALK PHOS: 70 U/L (ref 39–117)
ALT: 59 U/L — AB (ref 0–53)
AST: 42 U/L — ABNORMAL HIGH (ref 0–37)
BILIRUBIN DIRECT: 0.1 mg/dL (ref 0.0–0.3)
TOTAL PROTEIN: 7.1 g/dL (ref 6.0–8.3)
Total Bilirubin: 0.5 mg/dL (ref 0.2–1.2)

## 2016-01-11 LAB — POC URINALSYSI DIPSTICK (AUTOMATED)
BILIRUBIN UA: NEGATIVE
GLUCOSE UA: NEGATIVE
Ketones, UA: NEGATIVE
Leukocytes, UA: NEGATIVE
NITRITE UA: NEGATIVE
PH UA: 6.5
Protein, UA: NEGATIVE
RBC UA: NEGATIVE
SPEC GRAV UA: 1.025
Urobilinogen, UA: 0.2

## 2016-01-11 LAB — CBC WITH DIFFERENTIAL/PLATELET
BASOS ABS: 0 10*3/uL (ref 0.0–0.1)
Basophils Relative: 0.5 % (ref 0.0–3.0)
EOS PCT: 2.1 % (ref 0.0–5.0)
Eosinophils Absolute: 0.1 10*3/uL (ref 0.0–0.7)
HEMATOCRIT: 45.6 % (ref 39.0–52.0)
Hemoglobin: 15.4 g/dL (ref 13.0–17.0)
LYMPHS ABS: 1.5 10*3/uL (ref 0.7–4.0)
LYMPHS PCT: 27 % (ref 12.0–46.0)
MCHC: 33.8 g/dL (ref 30.0–36.0)
MCV: 92.1 fl (ref 78.0–100.0)
MONOS PCT: 9.8 % (ref 3.0–12.0)
Monocytes Absolute: 0.6 10*3/uL (ref 0.1–1.0)
NEUTROS ABS: 3.4 10*3/uL (ref 1.4–7.7)
NEUTROS PCT: 60.6 % (ref 43.0–77.0)
PLATELETS: 203 10*3/uL (ref 150.0–400.0)
RBC: 4.95 Mil/uL (ref 4.22–5.81)
RDW: 13.1 % (ref 11.5–15.5)
WBC: 5.7 10*3/uL (ref 4.0–10.5)

## 2016-01-11 LAB — LIPID PANEL
CHOL/HDL RATIO: 3
Cholesterol: 165 mg/dL (ref 0–200)
HDL: 51.9 mg/dL (ref >39.00)
LDL Cholesterol: 92 mg/dL (ref 0–99)
NONHDL: 113.4
Triglycerides: 108 mg/dL (ref 0.0–149.0)
VLDL: 21.6 mg/dL (ref 0.0–40.0)

## 2016-01-11 LAB — TSH: TSH: 1.12 u[IU]/mL (ref 0.35–4.50)

## 2016-01-11 LAB — HEMOGLOBIN A1C: Hgb A1c MFr Bld: 5.4 % (ref 4.6–6.5)

## 2016-01-11 LAB — BASIC METABOLIC PANEL
BUN: 16 mg/dL (ref 6–23)
CALCIUM: 9.3 mg/dL (ref 8.4–10.5)
CHLORIDE: 102 meq/L (ref 96–112)
CO2: 30 meq/L (ref 19–32)
Creatinine, Ser: 1.16 mg/dL (ref 0.40–1.50)
GFR: 83.28 mL/min (ref >60.00)
GLUCOSE: 89 mg/dL (ref 70–99)
POTASSIUM: 3.7 meq/L (ref 3.5–5.1)
SODIUM: 139 meq/L (ref 135–145)

## 2016-01-11 NOTE — Patient Instructions (Addendum)
It was great meeting you today.   I will follow up with you regarding your blood work.   Follow up with me in 1-2 years or sooner if needed  Have a great trip to [place]  Health Maintenance, Male A healthy lifestyle and preventative care can promote health and wellness.  Maintain regular health, dental, and eye exams.  Eat a healthy diet. Foods like vegetables, fruits, whole grains, low-fat dairy products, and lean protein foods contain the nutrients you need and are low in calories. Decrease your intake of foods high in solid fats, added sugars, and salt. Get information about a proper diet from your health care provider, if necessary.  Regular physical exercise is one of the most important things you can do for your health. Most adults should get at least 150 minutes of moderate-intensity exercise (any activity that increases your heart rate and causes you to sweat) each week. In addition, most adults need muscle-strengthening exercises on 2 or more days a week.   Maintain a healthy weight. The body mass index (BMI) is a screening tool to identify possible weight problems. It provides an estimate of body fat based on height and weight. Your health care provider can find your BMI and can help you achieve or maintain a healthy weight. For males 20 years and older:  A BMI below 18.5 is considered underweight.  A BMI of 18.5 to 24.9 is normal.  A BMI of 25 to 29.9 is considered overweight.  A BMI of 30 and above is considered obese.  Maintain normal blood lipids and cholesterol by exercising and minimizing your intake of saturated fat. Eat a balanced diet with plenty of fruits and vegetables. Blood tests for lipids and cholesterol should begin at age 23 and be repeated every 5 years. If your lipid or cholesterol levels are high, you are over age 23, or you are at high risk for heart disease, you may need your cholesterol levels checked more frequently.Ongoing high lipid and cholesterol  levels should be treated with medicines if diet and exercise are not working.  If you smoke, find out from your health care provider how to quit. If you do not use tobacco, do not start.  Lung cancer screening is recommended for adults aged 55-80 years who are at high risk for developing lung cancer because of a history of smoking. A yearly low-dose CT scan of the lungs is recommended for people who have at least a 30-pack-year history of smoking and are current smokers or have quit within the past 15 years. A pack year of smoking is smoking an average of 1 pack of cigarettes a day for 1 year (for example, a 30-pack-year history of smoking could mean smoking 1 pack a day for 30 years or 2 packs a day for 15 years). Yearly screening should continue until the smoker has stopped smoking for at least 15 years. Yearly screening should be stopped for people who develop a health problem that would prevent them from having lung cancer treatment.  If you choose to drink alcohol, do not have more than 2 drinks per day. One drink is considered to be 12 oz (360 mL) of beer, 5 oz (150 mL) of wine, or 1.5 oz (45 mL) of liquor.  Avoid the use of street drugs. Do not share needles with anyone. Ask for help if you need support or instructions about stopping the use of drugs.  High blood pressure causes heart disease and increases the risk of stroke.  High blood pressure is more likely to develop in:  People who have blood pressure in the end of the normal range (100-139/85-89 mm Hg).  People who are overweight or obese.  People who are African American.  If you are 24-75 years of age, have your blood pressure checked every 3-5 years. If you are 31 years of age or older, have your blood pressure checked every year. You should have your blood pressure measured twice--once when you are at a hospital or clinic, and once when you are not at a hospital or clinic. Record the average of the two measurements. To check your  blood pressure when you are not at a hospital or clinic, you can use:  An automated blood pressure machine at a pharmacy.  A home blood pressure monitor.  If you are 80-23 years old, ask your health care provider if you should take aspirin to prevent heart disease.  Diabetes screening involves taking a blood sample to check your fasting blood sugar level. This should be done once every 3 years after age 68 if you are at a normal weight and without risk factors for diabetes. Testing should be considered at a younger age or be carried out more frequently if you are overweight and have at least 1 risk factor for diabetes.  Colorectal cancer can be detected and often prevented. Most routine colorectal cancer screening begins at the age of 1 and continues through age 72. However, your health care provider may recommend screening at an earlier age if you have risk factors for colon cancer. On a yearly basis, your health care provider may provide home test kits to check for hidden blood in the stool. A small camera at the end of a tube may be used to directly examine the colon (sigmoidoscopy or colonoscopy) to detect the earliest forms of colorectal cancer. Talk to your health care provider about this at age 20 when routine screening begins. A direct exam of the colon should be repeated every 5-10 years through age 71, unless early forms of precancerous polyps or small growths are found.  People who are at an increased risk for hepatitis B should be screened for this virus. You are considered at high risk for hepatitis B if:  You were born in a country where hepatitis B occurs often. Talk with your health care provider about which countries are considered high risk.  Your parents were born in a high-risk country and you have not received a shot to protect against hepatitis B (hepatitis B vaccine).  You have HIV or AIDS.  You use needles to inject street drugs.  You live with, or have sex with,  someone who has hepatitis B.  You are a man who has sex with other men (MSM).  You get hemodialysis treatment.  You take certain medicines for conditions like cancer, organ transplantation, and autoimmune conditions.  Hepatitis C blood testing is recommended for all people born from [birth year] through 1963 and any individual with known risk factors for hepatitis C.  Healthy men should no longer receive prostate-specific antigen (PSA) blood tests as part of routine cancer screening. Talk to your health care provider about prostate cancer screening.  Testicular cancer screening is not recommended for adolescents or adult males who have no symptoms. Screening includes self-exam, a health care provider exam, and other screening tests. Consult with your health care provider about any symptoms you have or any concerns you have about testicular cancer.  Practice safe sex. Use condoms  and avoid high-risk sexual practices to reduce the spread of sexually transmitted infections (STIs).  You should be screened for STIs, including gonorrhea and chlamydia if:  You are sexually active and are younger than 24 years.  You are older than 24 years, and your health care provider tells you that you are at risk for this type of infection.  Your sexual activity has changed since you were last screened, and you are at an increased risk for chlamydia or gonorrhea. Ask your health care provider if you are at risk.  If you are at risk of being infected with HIV, it is recommended that you take a prescription medicine daily to prevent HIV infection. This is called pre-exposure prophylaxis (PrEP). You are considered at risk if:  You are a man who has sex with other men (MSM).  You are a heterosexual man who is sexually active with multiple partners.  You take drugs by injection.  You are sexually active with a partner who has HIV.  Talk with your health care provider about whether you are at high risk of being  infected with HIV. If you choose to begin PrEP, you should first be tested for HIV. You should then be tested every 3 months for as long as you are taking PrEP.  Use sunscreen. Apply sunscreen liberally and repeatedly throughout the day. You should seek shade when your shadow is shorter than you. Protect yourself by wearing long sleeves, pants, a wide-brimmed hat, and sunglasses year round whenever you are outdoors.  Tell your health care provider of new moles or changes in moles, especially if there is a change in shape or color. Also, tell your health care provider if a mole is larger than the size of a pencil eraser.  A one-time screening for abdominal aortic aneurysm (AAA) and surgical repair of large AAAs by ultrasound is recommended for men aged 25-75 years who are current or former smokers.  Stay current with your vaccines (immunizations).   This information is not intended to replace advice given to you by your health care provider. Make sure you discuss any questions you have with your health care provider.   Document Released: 04/14/2008 Document Revised: 11/07/2014 Document Reviewed: 03/14/2011 Elsevier Interactive Patient Education [employer].

## 2016-01-11 NOTE — Progress Notes (Signed)
Patient presents to clinic today to establish care.He is a very pleasant Caucasian male, who  has a past medical history of Abdominal pain, recurrent; Anemia; and IBS (irritable bowel syndrome).   Acute Concerns: -Complete Physical   Chronic Issues: - None  Health Maintenance: Dental -- Twice a year Vision -- yearly, wears contacts Immunizations -- UTD  Diet: Eats healthy Exercise: Works out multiple times a year.    Past Medical History  Diagnosis Date  . Abdominal pain, recurrent   . Anemia   . IBS (irritable bowel syndrome)     Past Surgical History  Procedure Laterality Date  . Wisdom tooth extraction    . Anal rectal manometry N/A 06/03/2013    Procedure: ANAL RECTAL MANOMETRY;  Surgeon: Romie LeveeAlicia Thomas, MD;  Location: [facility];  Service: Endoscopy;  Laterality: N/A;    No current outpatient prescriptions on file prior to visit.   No current facility-administered medications on file prior to visit.    No Known Allergies  Family History  Problem Relation Age of Onset  . Leukemia Mother   . Cholelithiasis Mother   . Ulcers Maternal Aunt   . Inflammatory bowel disease Neg Hx   . Celiac disease Neg Hx   . Colon cancer Neg Hx     Social History   Social History  . Marital Status: Single    Spouse Name: N/A  . Number of Children: N/A  . Years of Education: N/A   Occupational History  . Not on file.   Social History Main Topics  . Smoking status: Never Smoker   . Smokeless tobacco: Never Used  . Alcohol Use: No     Comment: Occ   . Drug Use: No  . Sexual Activity: Yes    Birth Control/ Protection: Condom   Other Topics Concern  . Not on file   Social History Narrative   Goes to [employer]          Review of Systems  Constitutional: Negative.   HENT: Negative.   Eyes: Negative.   Respiratory: Negative.   Cardiovascular: Negative.   Gastrointestinal: Negative.   Genitourinary: Negative.   Musculoskeletal: Negative.   Skin:  Negative.   Neurological: Negative.   Endo/Heme/Allergies: Negative.   Psychiatric/Behavioral: Negative.     BP 120/80 mmHg  Pulse 76  Temp(Src) 98.5 F (36.9 C) (Oral)  Ht 5\' 8"  (1.727 m)  Wt 180 lb 6.4 oz (81.829 kg)  BMI 27.44 kg/m2  SpO2 97%  Physical Exam  Constitutional: He is oriented to person, place, and time and well-developed, well-nourished, and in no distress. No distress.  HENT:  Head: Normocephalic and atraumatic.  Right Ear: External ear normal.  Left Ear: External ear normal.  Nose: Nose normal.  Mouth/Throat: Oropharynx is clear and moist. No oropharyngeal exudate.  Eyes: Conjunctivae and EOM are normal. Pupils are equal, round, and reactive to light. Right eye exhibits no discharge. Left eye exhibits no discharge.  Neck: Normal range of motion. Neck supple. No JVD present. No tracheal deviation present. No thyromegaly present.  Cardiovascular: Normal rate, regular rhythm, normal heart sounds and intact distal pulses.  Exam reveals no friction rub.   No murmur heard. Pulmonary/Chest: Effort normal and breath sounds normal. No stridor. No respiratory distress. He has no wheezes. He has no rales. He exhibits no tenderness.  Abdominal: Soft. Bowel sounds are normal. He exhibits no distension and no mass. There is no tenderness. There is no rebound and no guarding.  Genitourinary:  Deferred  Musculoskeletal: Normal range of motion. He exhibits no edema or tenderness.  Lymphadenopathy:    He has no cervical adenopathy.  Neurological: He is alert and oriented to person, place, and time. Gait normal. GCS score is 15.  Skin: Skin is warm and dry. No rash noted. He is not diaphoretic. No erythema. No pallor.  Psychiatric: Memory, affect and judgment normal.  Nursing note and vitals reviewed.   Assessment/Plan: 1. Routine general medical examination at a health care facility - Basic metabolic panel - CBC with Differential/Platelet - Hemoglobin A1c - Hepatic  function panel - Lipid panel - TSH - POCT Urinalysis Dipstick (Automated) - Continue to eat healthy and exercise.  - Follow up in 1-2 years or sooner if needed

## 2018-10-23 ENCOUNTER — Other Ambulatory Visit: Payer: Self-pay

## 2018-10-23 ENCOUNTER — Encounter (HOSPITAL_COMMUNITY): Payer: Self-pay | Admitting: Emergency Medicine

## 2018-10-23 ENCOUNTER — Ambulatory Visit (HOSPITAL_COMMUNITY)
Admission: EM | Admit: 2018-10-23 | Discharge: 2018-10-23 | Disposition: A | Discharge status: Home / Self Care | Payer: BLUE CROSS/BLUE SHIELD | Attending: Family Medicine | Admitting: Family Medicine

## 2018-10-23 DIAGNOSIS — W540XXA Bitten by dog, initial encounter: Secondary | ICD-10-CM | POA: Diagnosis not present

## 2018-10-23 DIAGNOSIS — S61212A Laceration without foreign body of right middle finger without damage to nail, initial encounter: Secondary | ICD-10-CM

## 2018-10-23 MED ORDER — LIDOCAINE HCL 2 % IJ SOLN
INTRAMUSCULAR | Status: AC
  Filled 2018-10-23: qty 20

## 2018-10-23 MED ORDER — DOXYCYCLINE HYCLATE 100 MG PO TABS: 100.0000 mg | ORAL_TABLET | Freq: Two times a day (BID) | ORAL | 0 refills | Status: AC

## 2018-10-23 NOTE — ED Provider Notes (Signed)
[facility]    CSN: [id number] Arrival date & time: 10/23/18  1528     History   Chief Complaint Chief Complaint  Patient presents with  . Animal Bite    HPI Antonio Galvan is a 27 y.o. male.   This is the initial visit for this 27 year old man.  He reports a dog bite to right middle finger. The dog is UTD on rabies. Happened this afternoon.   Last tetanus shot was about 8 years ago.     Past Medical History:  Diagnosis Date  . Abdominal pain, recurrent   . Anemia   . IBS (irritable bowel syndrome)     Patient Active Problem List   Diagnosis Date Noted  . Acute low back pain 08/26/2013  . Anxiety state, unspecified 08/26/2013  . IBS (irritable bowel syndrome) 04/11/2013  . Rectal prolapse 04/11/2013    Past Surgical History:  Procedure Laterality Date  . ANAL RECTAL MANOMETRY N/A 06/03/2013   Procedure: ANAL RECTAL MANOMETRY;  Surgeon: Romie LeveeAlicia Thomas, MD;  Location: [facility];  Service: Endoscopy;  Laterality: N/A;  . WISDOM TOOTH EXTRACTION         Home Medications    Prior to Admission medications   Medication Sig Start Date End Date Taking? Authorizing Provider  doxycycline (VIBRA-TABS) 100 MG tablet Take 1 tablet (100 mg total) by mouth 2 (two) times daily. 10/23/18   Elvina SidleLauenstein, Lakiesha Ralphs, MD    Family History Family History  Problem Relation Age of Onset  . Leukemia Mother   . Cholelithiasis Mother   . Ulcers Maternal Aunt   . Inflammatory bowel disease Neg Hx   . Celiac disease Neg Hx   . Colon cancer Neg Hx     Social History Social History   Tobacco Use  . Smoking status: Never Smoker  . Smokeless tobacco: Never Used  Substance Use Topics  . Alcohol use: No    Alcohol/week: 0.0 standard drinks    Comment: Occ   . Drug use: No     Allergies   Patient has no known allergies.   Review of Systems Review of Systems   Physical Exam Triage Vital Signs ED Triage Vitals [10/23/18 1559]  Enc Vitals Group     BP  115/75     Pulse Rate (!) 52     Resp 16     Temp 98 F (36.7 C)     Temp Source Oral     SpO2 98 %     Weight      Height      Head Circumference      Peak Flow      Pain Score 4     Pain Loc      Pain Edu?      Excl. in GC?    No data found.  Updated Vital Signs BP 115/75   Pulse (!) 52   Temp 98 F (36.7 C) (Oral)   Resp 16   SpO2 98%    Physical Exam Vitals signs and nursing note reviewed.  Constitutional:      Appearance: Normal appearance.  HENT:     Head: Normocephalic.  Eyes:     Conjunctiva/sclera: Conjunctivae normal.  Pulmonary:     Effort: Pulmonary effort is normal.  Musculoskeletal:        General: Swelling and signs of injury present. No tenderness or deformity.     Comments: Full range of motion of right middle finger  Skin:  General: Skin is warm.     Comments: 1 cm laceration to proximal volar phalanx of right middle finger.  Good capillary filling distally, normal sensation distally, full range of motion.  Neurological:     General: No focal deficit present.     Mental Status: He is alert.  Psychiatric:        Mood and Affect: Mood normal.      UC Treatments / Results  Labs (all labs ordered are listed, but only abnormal results are displayed) Labs Reviewed - No data to display  EKG None  Radiology No results found.  Procedures Laceration Repair Date/Time: 10/23/2018 4:51 PM Performed by: Elvina SidleLauenstein, Haide Klinker, MD Authorized by: Elvina SidleLauenstein, Annette Bertelson, MD   Consent:    Consent obtained:  Verbal   Consent given by:  Patient   Risks discussed:  Infection and pain Anesthesia (see MAR for exact dosages):    Anesthesia method:  Local infiltration   Local anesthetic:  Lidocaine 2% w/o epi Laceration details:    Location:  Finger   Finger location:  R long finger   Length (cm):  1   Depth (mm):  4 Repair type:    Repair type:  Simple Pre-procedure details:    Preparation:  Patient was prepped and draped in usual sterile  fashion Exploration:    Hemostasis achieved with:  Direct pressure   Contaminated: yes   Treatment:    Area cleansed with:  Betadine and saline   Amount of cleaning:  Standard   Irrigation solution:  Tap water   Visualized foreign bodies/material removed: no   Skin repair:    Repair method:  Sutures   Suture size:  4-0   Suture material:  Nylon   Suture technique:  Horizontal mattress   Number of sutures:  3 Approximation:    Approximation:  Close Post-procedure details:    Dressing:  Bulky dressing   Patient tolerance of procedure:  Tolerated well, no immediate complications   (including critical care time)  Medications Ordered in UC Medications - No data to display  Initial Impression / Assessment and Plan / UC Course  I have reviewed the triage vital signs and the nursing notes.  Pertinent labs & imaging results that were available during my care of the patient were reviewed by me and considered in my medical decision making (see chart for details).    Final Clinical Impressions(s) / UC Diagnoses   Final diagnoses:  Dog bite, initial encounter  Laceration of right middle finger without foreign body without damage to nail, initial encounter     Discharge Instructions     Remove dressing in am, cover with bandaid when in dirty environment    ED Prescriptions    Medication Sig Dispense Auth. Provider   doxycycline (VIBRA-TABS) 100 MG tablet Take 1 tablet (100 mg total) by mouth 2 (two) times daily. 20 tablet Elvina SidleLauenstein, Neely Kammerer, MD     Controlled Substance Prescriptions [place] Controlled Substance Registry consulted? Not Applicable   Elvina SidleLauenstein, Alistair Senft, MD 10/23/18 [phone]

## 2018-10-23 NOTE — Discharge Instructions (Addendum)
Remove dressing in am, cover with bandaid when in dirty environment

## 2018-10-23 NOTE — ED Triage Notes (Signed)
PT has a dog bite to right middle finger. The dog is UTD on rabies. Happened this afternoon.
